# Patient Record
Sex: Female | Born: 1980 | Smoking: Never smoker
Health system: Southern US, Community
[De-identification: ages and names within clinical notes are randomized; demographics above are authoritative.]

---

## 2015-10-28 ENCOUNTER — Other Ambulatory Visit: Payer: Self-pay | Admitting: Obstetrics and Gynecology

## 2015-10-28 DIAGNOSIS — Z1231 Encounter for screening mammogram for malignant neoplasm of breast: Secondary | ICD-10-CM

## 2015-10-31 ENCOUNTER — Ambulatory Visit: Payer: Self-pay

## 2015-11-05 ENCOUNTER — Other Ambulatory Visit: Payer: Self-pay | Admitting: Obstetrics and Gynecology

## 2015-11-05 ENCOUNTER — Ambulatory Visit
Admission: RE | Admit: 2015-11-05 | Discharge: 2015-11-05 | Disposition: A | Discharge status: Home / Self Care | Payer: Managed Care, Other (non HMO) | Source: Ambulatory Visit | Attending: Obstetrics and Gynecology | Admitting: Obstetrics and Gynecology

## 2015-11-05 DIAGNOSIS — Z1231 Encounter for screening mammogram for malignant neoplasm of breast: Secondary | ICD-10-CM | POA: Diagnosis not present

## 2016-12-16 ENCOUNTER — Observation Stay (HOSPITAL_COMMUNITY)
Admission: AD | Admit: 2016-12-16 | Discharge: 2016-12-17 | Disposition: A | Discharge status: Home / Self Care | Payer: BLUE CROSS/BLUE SHIELD | Source: Ambulatory Visit | Attending: Obstetrics and Gynecology | Admitting: Obstetrics and Gynecology

## 2016-12-16 ENCOUNTER — Encounter (HOSPITAL_COMMUNITY): Payer: Self-pay | Admitting: *Deleted

## 2016-12-16 DIAGNOSIS — Z7984 Long term (current) use of oral hypoglycemic drugs: Secondary | ICD-10-CM | POA: Diagnosis not present

## 2016-12-16 DIAGNOSIS — N764 Abscess of vulva: Secondary | ICD-10-CM | POA: Diagnosis present

## 2016-12-16 DIAGNOSIS — E119 Type 2 diabetes mellitus without complications: Secondary | ICD-10-CM | POA: Insufficient documentation

## 2016-12-16 LAB — CBC WITH DIFFERENTIAL/PLATELET
BASOS PCT: 1 %
Basophils Absolute: 0 10*3/uL (ref 0.0–0.1)
EOS ABS: 0.1 10*3/uL (ref 0.0–0.7)
EOS PCT: 2 %
HCT: 36.9 % (ref 36.0–46.0)
Hemoglobin: 13.2 g/dL (ref 12.0–15.0)
LYMPHS ABS: 1.5 10*3/uL (ref 0.7–4.0)
Lymphocytes Relative: 22 %
MCH: 30.3 pg (ref 26.0–34.0)
MCHC: 35.8 g/dL (ref 30.0–36.0)
MCV: 84.6 fL (ref 78.0–100.0)
MONO ABS: 0.2 10*3/uL (ref 0.1–1.0)
MONOS PCT: 3 %
NEUTROS PCT: 72 %
Neutro Abs: 4.9 10*3/uL (ref 1.7–7.7)
PLATELETS: 237 10*3/uL (ref 150–400)
RBC: 4.36 MIL/uL (ref 3.87–5.11)
RDW: 12.5 % (ref 11.5–15.5)
WBC: 6.8 10*3/uL (ref 4.0–10.5)

## 2016-12-16 LAB — BASIC METABOLIC PANEL
Anion gap: 8 (ref 5–15)
BUN: 15 mg/dL (ref 6–20)
CHLORIDE: 98 mmol/L — AB (ref 101–111)
CO2: 27 mmol/L (ref 22–32)
CREATININE: 0.77 mg/dL (ref 0.44–1.00)
Calcium: 9.2 mg/dL (ref 8.9–10.3)
GFR calc non Af Amer: >60 mL/min (ref >60)
Glucose, Bld: 319 mg/dL — ABNORMAL HIGH (ref 65–99)
Potassium: 4.1 mmol/L (ref 3.5–5.1)
SODIUM: 133 mmol/L — AB (ref 135–145)

## 2016-12-16 MED ORDER — INSULIN GLARGINE 100 UNIT/ML ~~LOC~~ SOLN
36.0000 [IU] | Freq: Every day | SUBCUTANEOUS | Status: DC
  Administered 2016-12-16: 36 [IU] via SUBCUTANEOUS
  Filled 2016-12-16 (×2): qty 0.36

## 2016-12-16 MED ORDER — PRENATAL MULTIVITAMIN CH
1.0000 | ORAL_TABLET | Freq: Every day | ORAL | Status: DC
  Administered 2016-12-17: 1 via ORAL
  Filled 2016-12-16: qty 1

## 2016-12-16 MED ORDER — METFORMIN HCL 500 MG PO TABS
1000.0000 mg | ORAL_TABLET | Freq: Every day | ORAL | Status: DC
  Filled 2016-12-16: qty 2

## 2016-12-16 MED ORDER — SERTRALINE HCL 25 MG PO TABS
25.0000 mg | ORAL_TABLET | Freq: Every day | ORAL | Status: DC
  Administered 2016-12-16: 25 mg via ORAL
  Filled 2016-12-16 (×2): qty 1

## 2016-12-16 MED ORDER — VANCOMYCIN HCL IN DEXTROSE 1-5 GM/200ML-% IV SOLN
1000.0000 mg | Freq: Three times a day (TID) | INTRAVENOUS | Status: DC
  Administered 2016-12-17 (×2): 1000 mg via INTRAVENOUS
  Filled 2016-12-16 (×3): qty 200

## 2016-12-16 MED ORDER — DOCUSATE SODIUM 100 MG PO CAPS
100.0000 mg | ORAL_CAPSULE | Freq: Two times a day (BID) | ORAL | Status: DC
  Administered 2016-12-16: 100 mg via ORAL
  Filled 2016-12-16 (×2): qty 1

## 2016-12-16 MED ORDER — VANCOMYCIN HCL IN DEXTROSE 1-5 GM/200ML-% IV SOLN
1000.0000 mg | Freq: Once | INTRAVENOUS | Status: AC
  Administered 2016-12-16: 1000 mg via INTRAVENOUS
  Filled 2016-12-16: qty 200

## 2016-12-16 MED ORDER — ACETAMINOPHEN 325 MG PO TABS
650.0000 mg | ORAL_TABLET | ORAL | Status: DC | PRN
  Administered 2016-12-16 – 2016-12-17 (×4): 650 mg via ORAL
  Filled 2016-12-16 (×4): qty 2

## 2016-12-16 MED ORDER — LACTATED RINGERS IV SOLN
INTRAVENOUS | Status: DC
  Administered 2016-12-16: 22:00:00 via INTRAVENOUS

## 2016-12-16 MED ORDER — VANCOMYCIN HCL 500 MG IV SOLR: 500.0000 mg | Freq: Two times a day (BID) | INTRAVENOUS | Status: DC

## 2016-12-16 MED ORDER — OXYCODONE-ACETAMINOPHEN 5-325 MG PO TABS: 1.0000 | ORAL_TABLET | ORAL | Status: DC | PRN

## 2016-12-16 NOTE — Progress Notes (Signed)
Pharmacy Antibiotic Note  Virginia Ellis is a 36 y.o. female admitted on 12/16/2016 with vulvar abscess.  Pharmacy has been consulted for Vancomycin dosing after pt has failed outpatient tx with Clindamycin x 2 days. I&D performed in office today.   Plan: Vancomycin 1 gram IV q8h Will plan to follow and assess need for Vanc trough depending on duration of therapy. Will follow results/sensitivities of wound culture   Height: 5\' 11"  (180.3 cm) Weight: 205 lb (93 kg) IBW/kg (Calculated) : 70.8  Temp (24hrs), Avg:99.7 F (37.6 C), Min:98.7 F (37.1 C), Max:100.3 F (37.9 C)   Recent Labs Lab 12/16/16 1817  WBC 6.8  CREATININE 0.77    Estimated Creatinine Clearance: 121.1 mL/min (by C-G formula based on SCr of 0.77 mg/dL).    No Known Allergies  Antimicrobials this admission:   Dose adjustments this admission:   Microbiology results: Wound culture taken today at I&D site  Thank you for allowing pharmacy to be a part of this patient's care.  Claybon Jabsngel, Quintez Maselli G 12/16/2016 7:53 PM

## 2016-12-16 NOTE — H&P (Signed)
Virginia Ellis is an 36 y.o. female. Presenting with vulvar abscess. She was treated as an outpatient with Clindamycin and had worsening of abscess over 48 hours. She has a h/o poorly controlled T2DM. I&D done in office and packed with iodoform gauze. Patient reports subjective shaking chills and feels feverish. Given this clinical picture, she was admitted for IV abx.   Pertinent Gynecological History: Regular mesnes Last pap: Normal 2017 OB History: G0   No LMP recorded.    PMH:  T2DM Depression/anxiety  No past surgical history on file.  No family history on file.  Social History:  has no tobacco, alcohol, and drug history on file.  Allergies: Allergies not on file  No prescriptions prior to admission.    ROS  Blood pressure (!) 137/91, pulse (!) 106, temperature 98.7 F (37.1 C), temperature source Oral, resp. rate 17, SpO2 99 %. Physical Exam Gen: well-appearing, NAD. Anxious Pulm: NWOB Cv: tachy to low 100s Abd: soft, obese, nontender GYN: L labia with ~5cm abscess s/p I&D w/packing in place. +erythema, induration, swelling. + purulent drainage from I&D site. Culture sent in office.  No results found for this or any previous visit (from the past 24 hour(s)).  No results found.  Assessment/Plan: 35yo admitted for observation and IV abx given worsening of vulvar abscess on PO abx and rf of poorly controlled Dm.   Vulvar abscess: likely s/s poorly controlled Dm. CTM closely. S/p clindamycin x 2 days with worsening of surrounding cellulitis. Now s/p I&D in the office. 1cm incision that is packed - CBC/BMP sent - wound care c/s for home packing - vancomycin as suspect MRSA as cause with some s/s od systemic illness (tachycardia/shaking chills) and RF of DM; transition to PO abx once improv't  - wound cx taken in office, will f/u results/sensitivities - consider BCX if fever >100.70F  Poorly controlled T2DM: - A1c - cont home insulin - 36 units - pre-meal BS per DM  protocol  Anxiety/depression: - cont sertraline 25mg  QHS  Virginia Ellis 12/16/2016, 6:08 PM

## 2016-12-17 DIAGNOSIS — N764 Abscess of vulva: Secondary | ICD-10-CM | POA: Diagnosis not present

## 2016-12-17 LAB — GLUCOSE, CAPILLARY
GLUCOSE-CAPILLARY: 260 mg/dL — AB (ref 65–99)
Glucose-Capillary: 268 mg/dL — ABNORMAL HIGH (ref 65–99)
Glucose-Capillary: 424 mg/dL — ABNORMAL HIGH (ref 65–99)

## 2016-12-17 LAB — HEMOGLOBIN A1C
HEMOGLOBIN A1C: 11.4 % — AB (ref 4.8–5.6)
MEAN PLASMA GLUCOSE: 280 mg/dL

## 2016-12-17 MED ORDER — INSULIN GLARGINE 100 UNIT/ML ~~LOC~~ SOLN: 36.0000 [IU] | Freq: Every day | SUBCUTANEOUS | 11 refills | Status: AC

## 2016-12-17 MED ORDER — METFORMIN HCL 500 MG PO TABS
1000.0000 mg | ORAL_TABLET | Freq: Every day | ORAL | Status: DC
  Administered 2016-12-17: 1000 mg via ORAL
  Filled 2016-12-17 (×2): qty 2

## 2016-12-17 MED ORDER — SULFAMETHOXAZOLE-TRIMETHOPRIM 800-160 MG PO TABS
1.0000 | ORAL_TABLET | Freq: Two times a day (BID) | ORAL | Status: DC
  Administered 2016-12-17: 1 via ORAL
  Filled 2016-12-17 (×3): qty 1

## 2016-12-17 MED ORDER — INSULIN ASPART 100 UNIT/ML ~~LOC~~ SOLN
0.0000 [IU] | Freq: Three times a day (TID) | SUBCUTANEOUS | Status: DC
  Administered 2016-12-17: 8 [IU] via SUBCUTANEOUS

## 2016-12-17 MED ORDER — INSULIN ASPART 100 UNIT/ML ~~LOC~~ SOLN
4.0000 [IU] | Freq: Three times a day (TID) | SUBCUTANEOUS | Status: DC
  Administered 2016-12-17: 4 [IU] via SUBCUTANEOUS

## 2016-12-17 MED ORDER — INSULIN ASPART 100 UNIT/ML ~~LOC~~ SOLN: 4.0000 [IU] | Freq: Three times a day (TID) | SUBCUTANEOUS | 11 refills | Status: AC

## 2016-12-17 MED ORDER — INSULIN ASPART 100 UNIT/ML ~~LOC~~ SOLN
0.0000 [IU] | Freq: Every day | SUBCUTANEOUS | Status: DC
  Administered 2016-12-17: 5 [IU] via SUBCUTANEOUS

## 2016-12-17 MED ORDER — SULFAMETHOXAZOLE-TRIMETHOPRIM 800-160 MG PO TABS
1.0000 | ORAL_TABLET | Freq: Two times a day (BID) | ORAL | 0 refills | Status: AC
Start: 2016-12-17 — End: 2016-12-27

## 2016-12-17 MED ORDER — LIVING WELL WITH DIABETES BOOK
Freq: Once | Status: AC
  Administered 2016-12-17: 10:00:00
  Filled 2016-12-17: qty 1

## 2016-12-17 NOTE — Consult Note (Signed)
WOC Nurse wound consult note Reason for Consult: I & D site to left labia. Wound type:Infectious  Pressure Injury POA: N/A Measurement: 0.5 cm opening 0.7 cm in depth.  Surrounding induration and erythema.   Wound bed:Not visible Drainage (amount, consistency, odor) None noted with assessment.  Serosanguinous drainage noted on dressing.  Periwound:Erythema and induration Dressing procedure/placement/frequency:Cleanse wound to left labia with NS and pat gently dry.  Gently fill wound bed with Iodoform packing strip (provided to patient).  Cover with 4x4 gauze and secure with tape.  Change daily.  HH to complete an education session to reinforce to partner.  Educated to remove soiled 4x4 if urine gets on the bandage and cover with a clean 4x4.  Verbalized understanding.  Will be discharged with oral antibiotics, pain medication and wound care supplies.  Will not follow at this time.  Please re-consult if needed.  Maple HudsonKaren Daymen Hassebrock RN BSN CWON Pager [phone]

## 2016-12-17 NOTE — Progress Notes (Signed)
CSW received consult for home health to be arranged prior to discharge today.  The Case Management department handles this need.  CSW left message for T. Craft/Case Manager to inform her of consult.  CSW spoke with pt's bedside RN to ask that she follow up with Case Manager.  CSW is screening out referral at this time.  Please call CSW if social/emotional concerns are noted.

## 2016-12-17 NOTE — Progress Notes (Signed)
Inpatient Diabetes Program Recommendations  AACE/ADA: New Consensus Statement on Inpatient Glycemic Control (2015)  Target Ranges:  Prepandial:   less than 140 mg/dL      Peak postprandial:   less than 180 mg/dL (1-2 hours)      Critically ill patients:  140 - 180 mg/dL   Lab Results  Component Value Date   GLUCAP 260 (H) 12/17/2016   HGBA1C 11.4 (H) 12/16/2016    Review of Glycemic Control Results for Virginia Ellis, Virginia Ellis (MRN [id number]) as of 12/17/2016 10:03  Ref. Range 12/17/2016 00:06 12/17/2016 09:05  Glucose-Capillary Latest Ref Range: 65 - 99 mg/dL 409424 (H) 811260 (H)   Diabetes history: DM2 Outpatient Diabetes medications: Lantus 36 units q hs + Metformin 1 gm bid + Aspart 5 units ac dinner Current orders for Inpatient glycemic control: Lantus 36 units q hs + Metformin 1 gm bid + Novolog correction 0-15 units tid + 0-5 units hs  Inpatient Diabetes Program Recommendations:  Spoke with patient by phone. Patient has been taking her Lantus 36 units daily but has not been taking her Metformin due to some GI upset and has not been taking her Aspart insulin. Reviewed with patient current A1c is 11.4.  (average CBG 280 over the past 2-3 months) along with risks of elevated CBGs. Also discussed need to keep appointments and followups with PCP and endo. Patient states she has not followed up with endocrinologist due to change in insurance and will be able to followup soon. Endocrinologist unaware patient is not taking Metformin and Aspart insulin. Ordered Living Well With Diabetes. May benefit by adding Novolog 4 units meal coverage tid if eats 50%. Will follow.  Thank you, Billy FischerJudy E. Denelle Capurro, RN, MSN, CDE Inpatient Glycemic Control Team Team Pager [phone] (8am-5pm) 12/17/2016 10:17 AM

## 2016-12-17 NOTE — Progress Notes (Signed)
S: Feeling improved. Tolerating vanc well.  O:  Vitals:   12/17/16 0012 12/17/16 0458  BP: 127/66 121/75  Pulse: 96 78  Resp: 16 17  Temp: 99.6 F (37.6 C) 98.2 F (36.8 C)    Gen: NAD, A&O Cv: reg rate Pulm: NWOB GYN: L labia with improving abscess. Now ~2cm, still draining, packing removed and replaced. Erythema regressing. Induration minimal, swelling improved.   A/P: 35yo admitted for observation and IV abx given worsening of vulvar abscess on PO abx and rf of poorly controlled Dm.   Vulvar abscess: likely s/s poorly controlled Dm. CTM closely. S/p clindamycin x 2 days with worsening of surrounding cellulitis. Now s/p I&D in the office. 1cm incision that is packed. Improved one exam this Am.  - wound care c/s for home packing - cont vancomycin, transition to PO abx later today - wound cx taken in office, will f/u results/sensitivities - consider BCX if fever >100.18F  Poorly controlled T2DM: - A1c 11.4%, BS in house 300-400s - cont home insulin - 36 units; Cont SSI while in house, goal BS <200 - restart Metformin 1000k (pt wasn't taking b/c "forgets") - pre-meal BS per DM protocol - c/s diabetes counselor  Anxiety/depression: - cont sertraline 25mg  QHS

## 2016-12-17 NOTE — Progress Notes (Signed)
MD notified of 260 CBG, wound care consult and diabetes consult.

## 2016-12-17 NOTE — Discharge Summary (Signed)
GYN Discharge Summary     Patient Name: Virginia Ellis DOB: Sep 01, 1981 MRN: [id number]  Date of admission: 12/16/2016 Delivering MD: This patient has no babies on file.  Date of discharge: 12/17/2016  Admitting diagnosis: IV ANTIBIOTICS Intrauterine pregnancy: Unknown     Secondary diagnosis:  Active Problems:   Vulvar abscess  Additional problems: Poorly controlled T2DM     Discharge diagnosis: Vulvar abscess on left labia, poorly controleld T2DM                                            Complications: None  Hospital course:  35yo admitted for observation and IV abx given worsening of vulvar abscess on PO abx and rf of poorly controlled Dm. Abscess likely s/s poorly controlled Dm. She was s/p clindamycin x 2 days with worsening of surrounding cellulitis. She had an I&D in the office and was admitted for IV vanc. She received 24 hours of vanc with improvement in abscess. The abscess was packed and cleaned - and dressings changed BID. Wound care was consulted and scheduled home health to care for her wound upon d/c.  She was noted to have an A1c of 11.4%, BS in house 300-400s She was continued on home insulin - 36 units and her Metformin 1000k was restarted. She met with a diabetes counselor who recommended initiating insulin with meals as well. This was performed and sugars improved.    Physical exam  Vitals:   12/17/16 0012 12/17/16 0458 12/17/16 0805 12/17/16 1158  BP: 127/66 121/75 124/67 128/83  Pulse: 96 78 79 71  Resp: '16 17 16 16  ' Temp: 99.6 F (37.6 C) 98.2 F (36.8 C) 98.1 F (36.7 C) 98.3 F (36.8 C)  TempSrc: Oral Oral Oral Oral  SpO2: 95% 99% 99% 97%  Weight:      Height:       Gen: NAD, A&O Cv: reg rate Pulm: NWOB GYN: L labia with improving abscess.Still draining, packing removed and replaced. Erythema regressing. Induration minimal, swelling improved.   Labs: Lab Results  Component Value Date   WBC 6.8 12/16/2016   HGB 13.2 12/16/2016   HCT 36.9  12/16/2016   MCV 84.6 12/16/2016   PLT 237 12/16/2016   CMP Latest Ref Rng & Units 12/16/2016  Glucose 65 - 99 mg/dL 319(H)  BUN 6 - 20 mg/dL 15  Creatinine 0.44 - 1.00 mg/dL 0.77  Sodium 135 - 145 mmol/L 133(L)  Potassium 3.5 - 5.1 mmol/L 4.1  Chloride 101 - 111 mmol/L 98(L)  CO2 22 - 32 mmol/L 27  Calcium 8.9 - 10.3 mg/dL 9.2    Discharge instruction: per After Visit Summary and "Baby and Me Booklet".  After visit meds:  Allergies as of 12/17/2016   No Known Allergies     Medication List    TAKE these medications   insulin aspart 100 UNIT/ML injection Commonly known as:  novoLOG Inject 4 Units into the skin 3 (three) times daily with meals.   insulin glargine 100 UNIT/ML injection Commonly known as:  LANTUS Inject 0.36 mLs (36 Units total) into the skin at bedtime.   metFORMIN 1000 MG tablet Commonly known as:  GLUCOPHAGE Take 500 mg by mouth 2 (two) times daily with a meal.   prenatal multivitamin Tabs tablet Take 1 tablet by mouth at bedtime.   sertraline 25 MG tablet Commonly known as:  ZOLOFT  Take 25 mg by mouth at bedtime.   sulfamethoxazole-trimethoprim 800-160 MG tablet Commonly known as:  BACTRIM DS,SEPTRA DS Take 1 tablet by mouth 2 (two) times daily.       Diet: carb modified diet  Activity: Advance as tolerated. Pelvic rest.   Outpatient follow up:4 days (tuesday) Follow up Appt:No future appointments. Follow up Visit:No Follow-up on file.   12/17/2016 Tyson Dense, MD

## 2016-12-17 NOTE — Progress Notes (Signed)
Discharge teaching complete with pt. Home health set up with case management and will come to patient's home tomorrow to do dressing change. Pt educated on insulin and demonstrated to RN on how to give herself insulin. Pt ambulated out of the hospital and discharged home to family.

## 2016-12-17 NOTE — Care Management Note (Signed)
Case Management Note  Patient Details  Name: Virginia Ellis MRN: [id number] Date of Birth: 06/15/1981  Subjective/Objective:  36 year old female admitted 12/16/16 with vulvar abscess.                 Action/Plan:D/C when medically stable.   Expected Discharge Date:  12/17/16               Expected Discharge Plan:  Home w Home Health Services  In-House Referral:  NA  Discharge planning Services  CM Consult  Post Acute Care Choice:  Home Health Choice offered to:  Patient  DME Arranged:  N/A DME Agency:     HH Arranged:  RN HH Agency:  [facility]  Status of Service:  Completed, signed off  Additional Comments:CM received HH orders and spoke with pt [facility]  Pt with no preference so [name] and confirmation received.  [name] RNC-MNN, BSN 12/17/2016, 3:06 PM

## 2017-05-20 IMAGING — MG MM SCREENING BREAST TOMO BILATERAL
8 of 12 series · 8 of 28 positions shown · non-contrast
Comparison: No prior exams.

CLINICAL DATA: Screening. Baseline screening mammogram.

EXAM:
DIGITAL SCREENING BILATERAL MAMMOGRAM WITH 3D TOMO WITH CAD

[R CC]
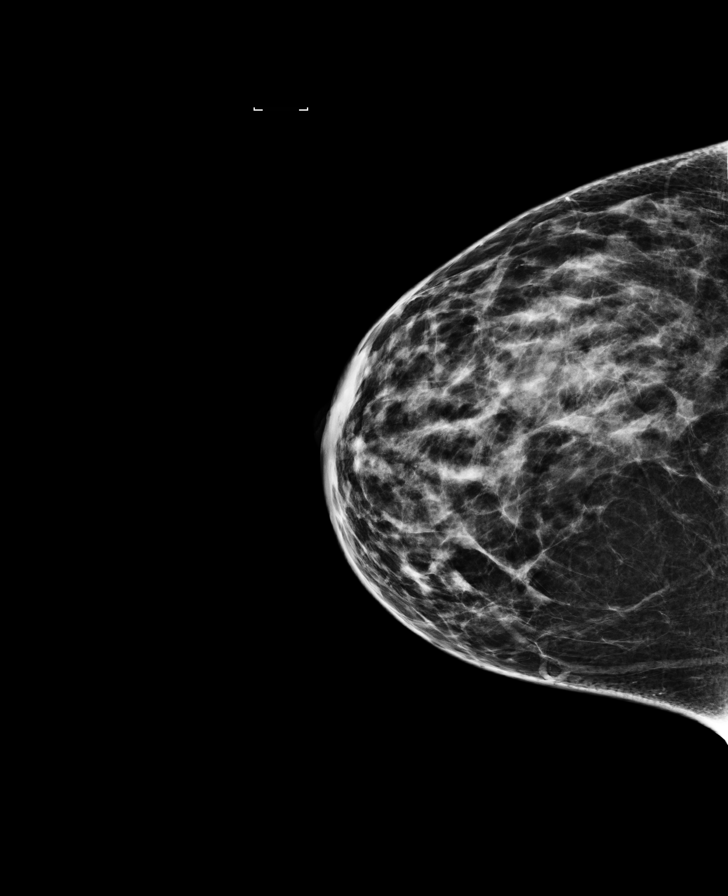

[R CC synth-2D]
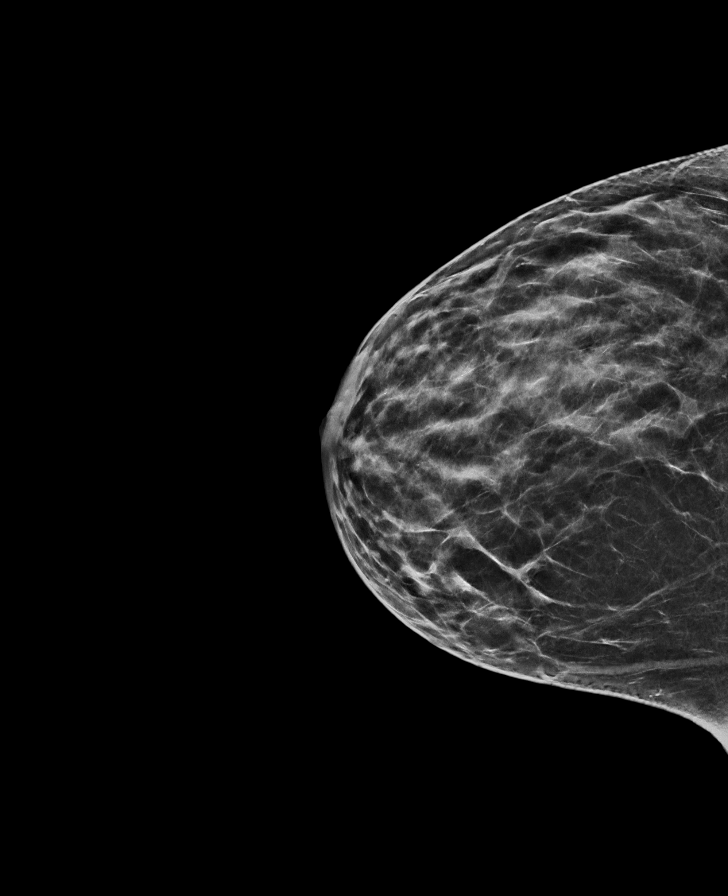

[L CC synth-2D]
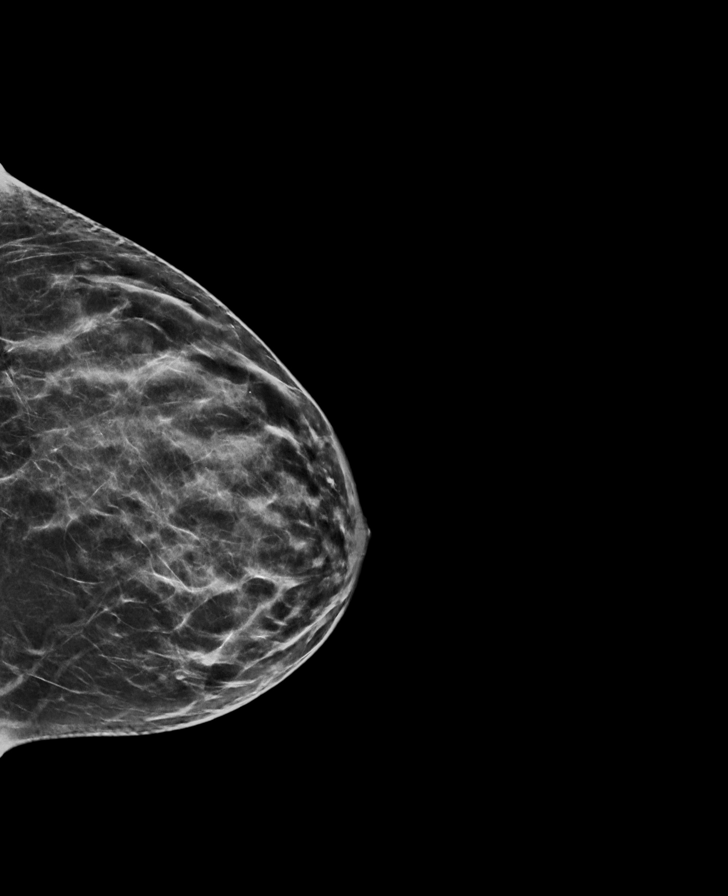

[L MLO synth-2D]
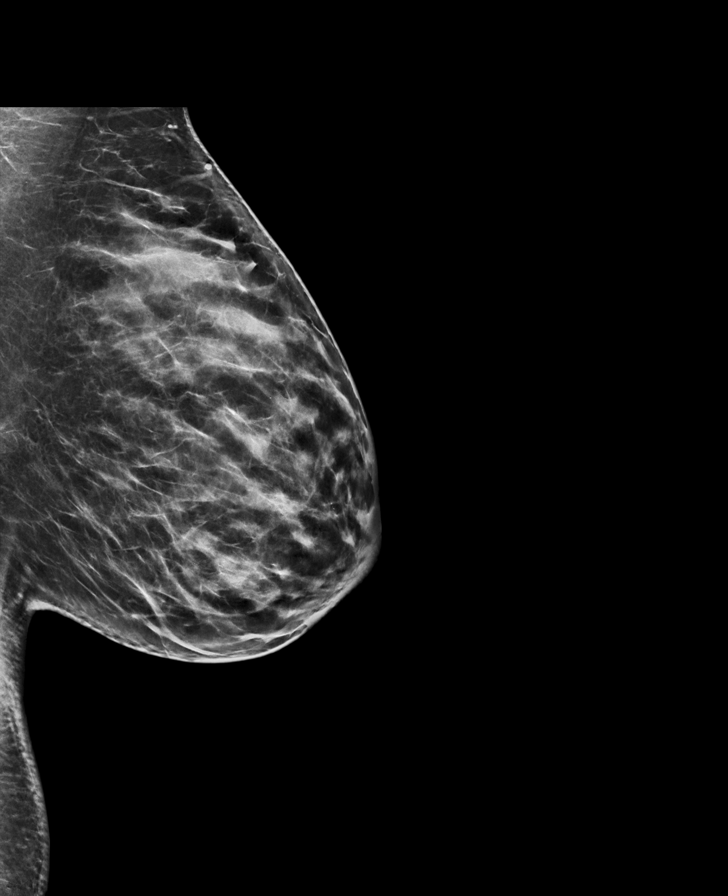

[L MLO]
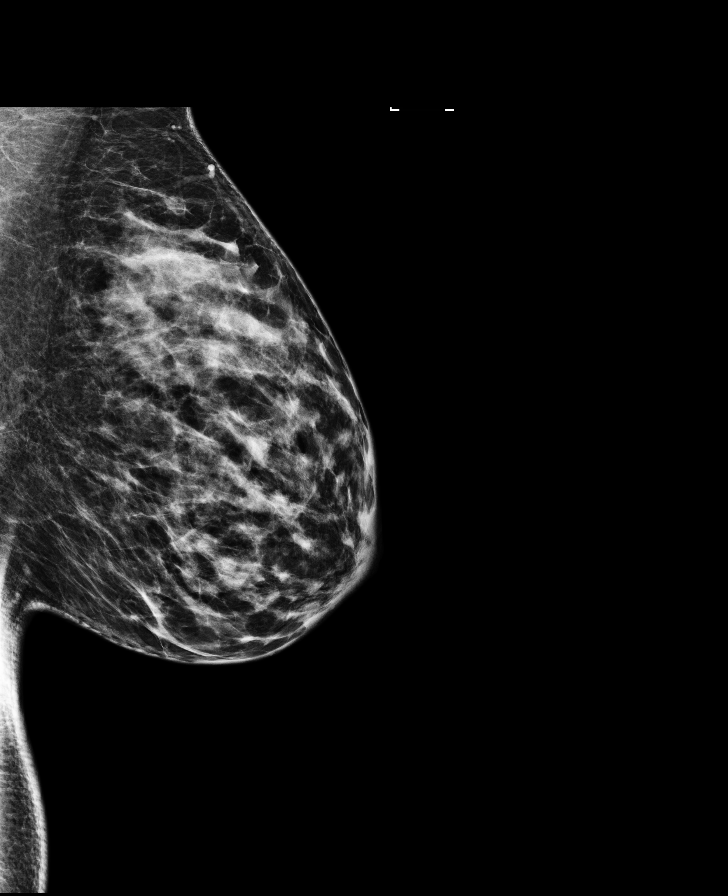

[L CC]
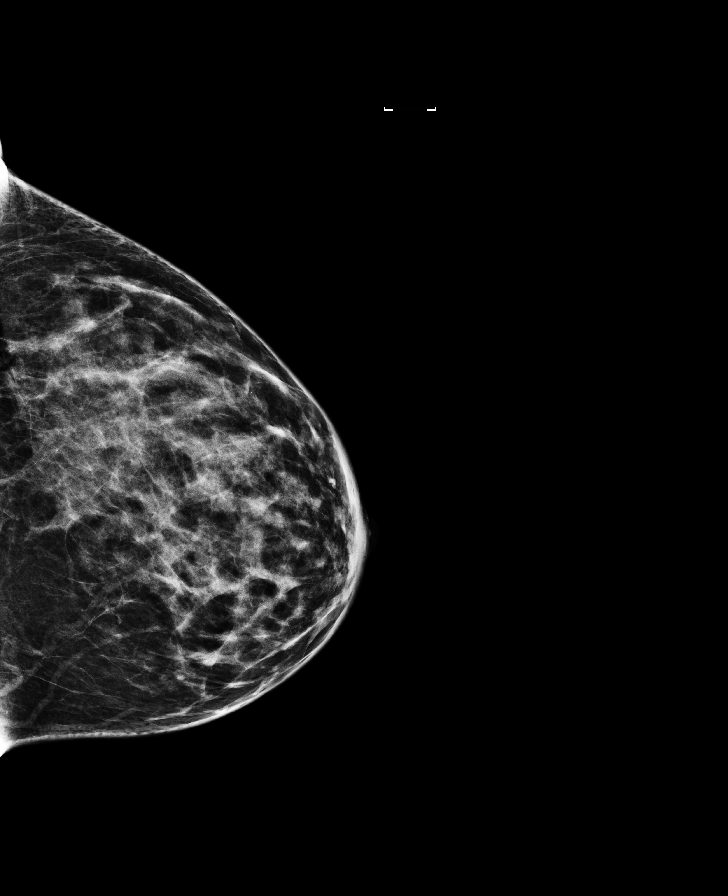

[R MLO synth-2D]
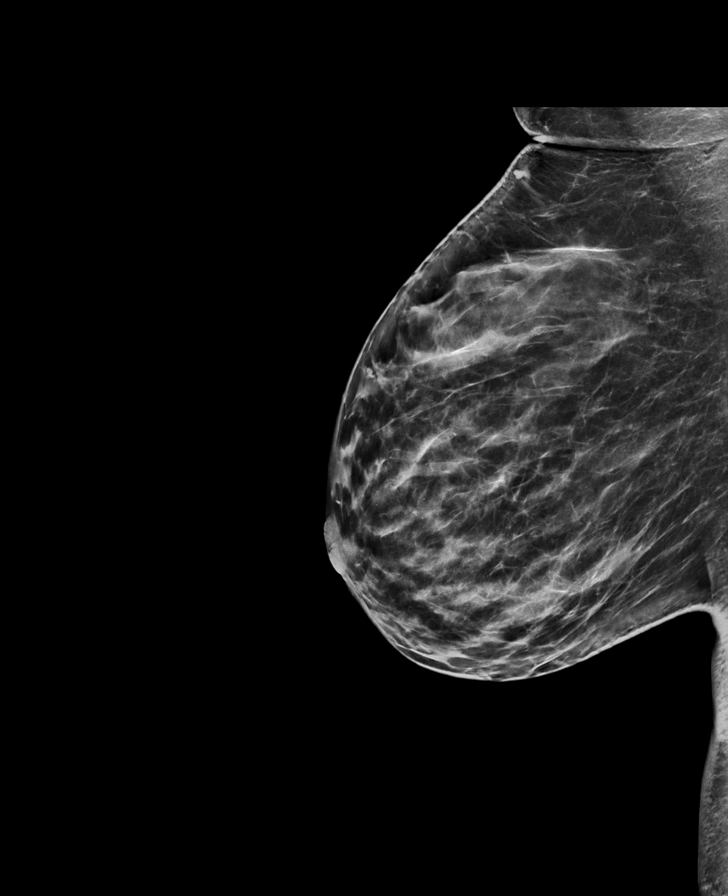

[R MLO]
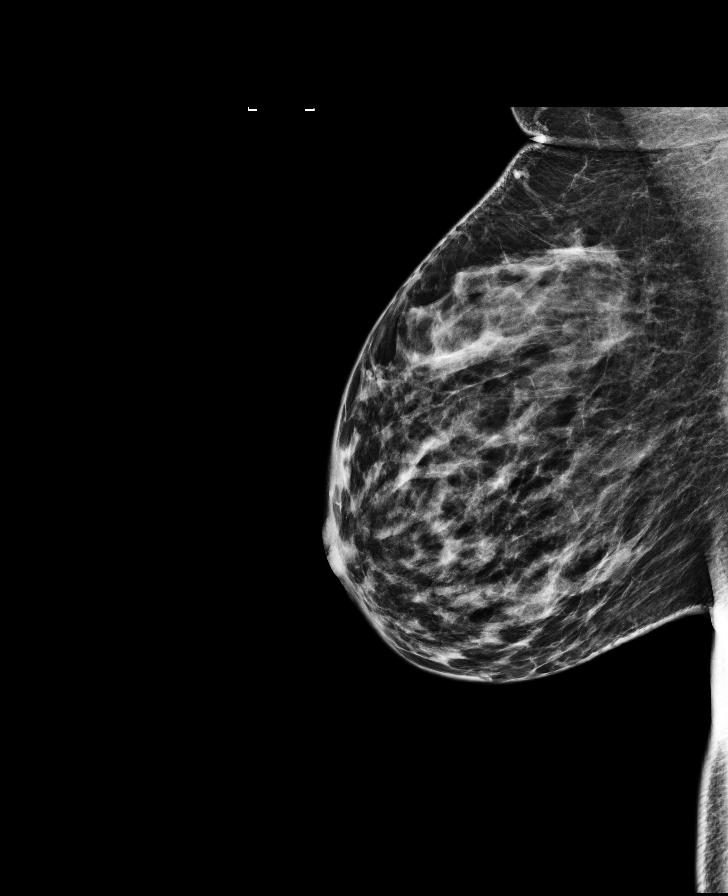

[8 of 28 positions shown; findings below may reference images not displayed]

ACR Breast Density Category c: The breast tissue is heterogeneously
dense, which may obscure small masses.
FINDINGS: There are no findings suspicious for malignancy. Images were
processed with CAD.
IMPRESSION: No mammographic evidence of malignancy. A result letter of this
screening mammogram will be mailed directly to the patient.

RECOMMENDATION:
Screening mammogram at age 38. (Code:P5-A-M1M)

BI-RADS CATEGORY  1: Negative.

## 2019-09-24 ENCOUNTER — Other Ambulatory Visit: Payer: Self-pay | Admitting: Internal Medicine

## 2019-09-24 DIAGNOSIS — R7401 Elevation of levels of liver transaminase levels: Secondary | ICD-10-CM

## 2019-10-24 ENCOUNTER — Ambulatory Visit
Admission: RE | Admit: 2019-10-24 | Discharge: 2019-10-24 | Disposition: A | Discharge status: Home / Self Care | Payer: Managed Care, Other (non HMO) | Source: Ambulatory Visit | Attending: Internal Medicine | Admitting: Internal Medicine

## 2019-10-24 DIAGNOSIS — R7401 Elevation of levels of liver transaminase levels: Secondary | ICD-10-CM

## 2019-11-06 ENCOUNTER — Ambulatory Visit
Admission: RE | Admit: 2019-11-06 | Discharge: 2019-11-06 | Disposition: A | Discharge status: Home / Self Care | Payer: Managed Care, Other (non HMO) | Source: Ambulatory Visit | Attending: Internal Medicine | Admitting: Internal Medicine

## 2021-05-08 IMAGING — US US ABDOMEN LIMITED W/ ELASTOGRAPHY
1 series · 12 of 12 positions shown · non-contrast
Comparison: None.

CLINICAL DATA: Transaminitis.

EXAM:
US ABDOMEN LIMITED - RIGHT UPPER QUADRANT
ULTRASOUND HEPATIC ELASTOGRAPHY
TECHNIQUE: Sonography of the right upper quadrant was performed. In addition,
ultrasound elastography evaluation of the liver was performed. A
region of interest was placed within the right lobe of the liver.
Following application of a compressive sonographic pulse, tissue
compressibility was assessed. Multiple assessments were performed at
the selected site. Median tissue compressibility was determined.
Previously, hepatic stiffness was assessed by shear wave velocity.
Based on recently published Society of Radiologists in Ultrasound
consensus article, reporting is now recommended to be performed in
the SI units of pressure (kiloPascals) representing hepatic
stiffness/elasticity. The obtained result is compared to the
published reference standards. (cACLD= compensated Advanced Chronic
Liver Disease)

[Series 2: us abdomen limited w/ elastography · 0.19mm/px · 12 of 12 slices shown]
[im 1/12]
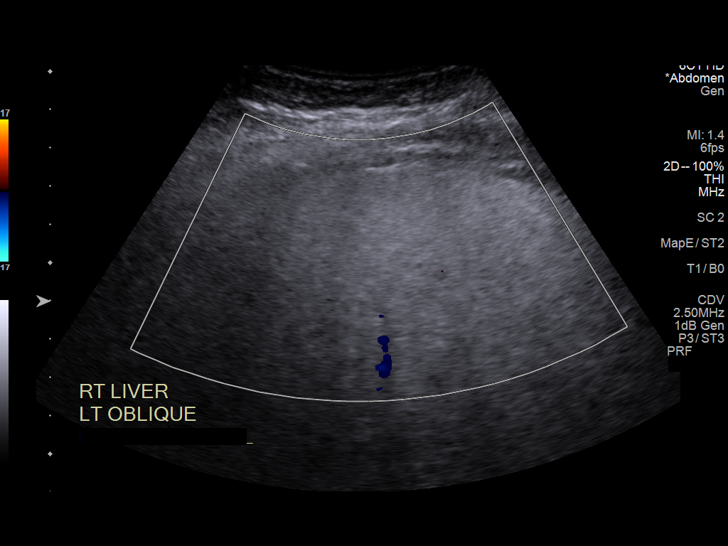
[im 2/12]
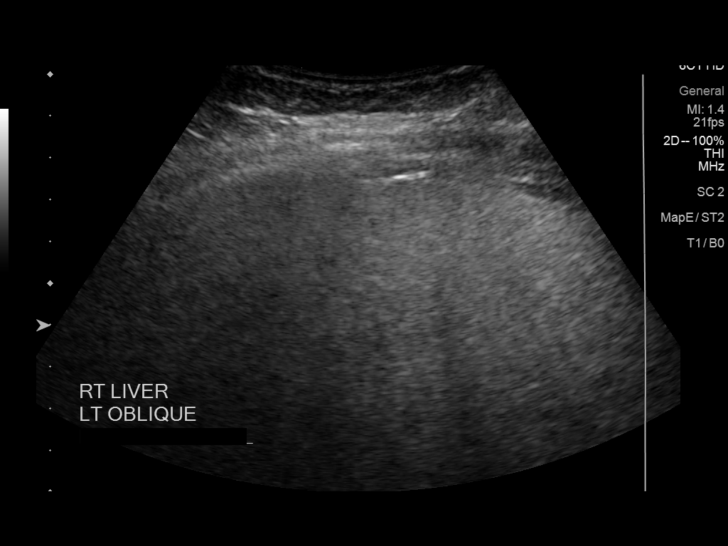
[im 3/12]
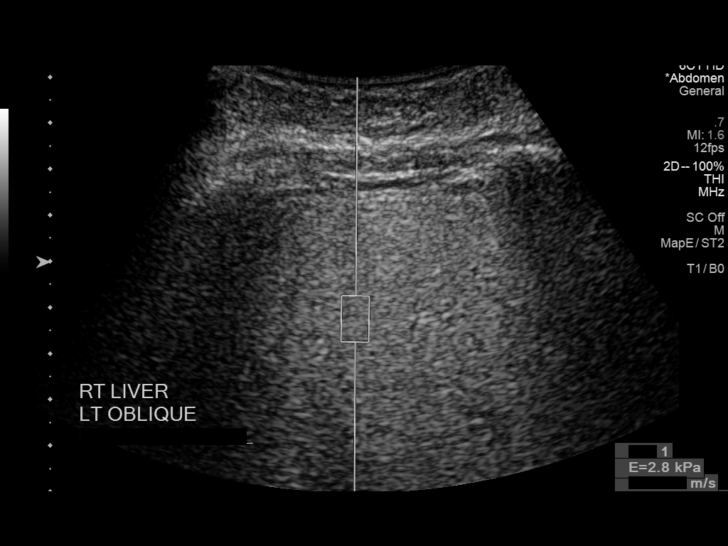
[im 4/12]
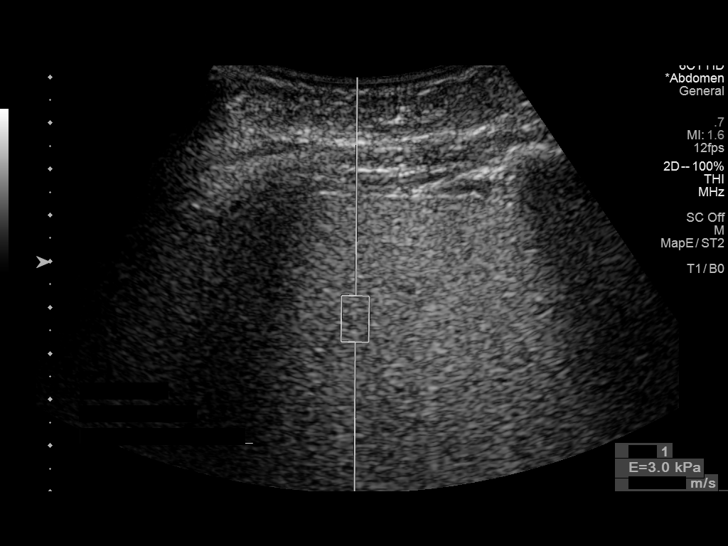
[im 5/12]
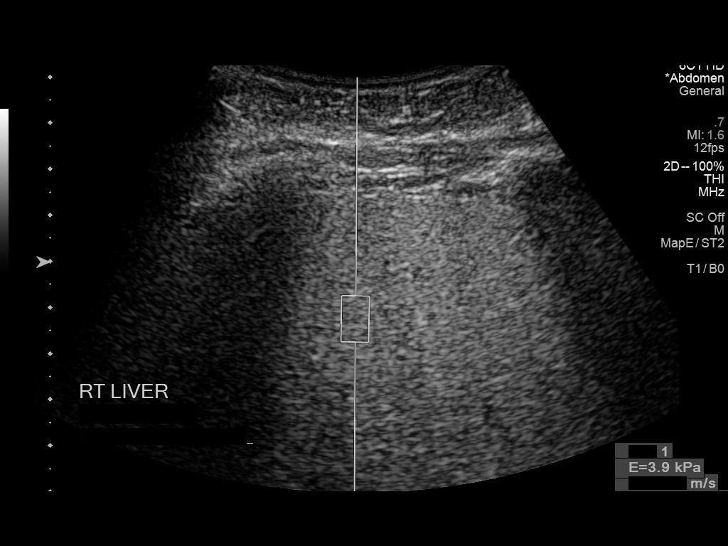
[im 6/12]
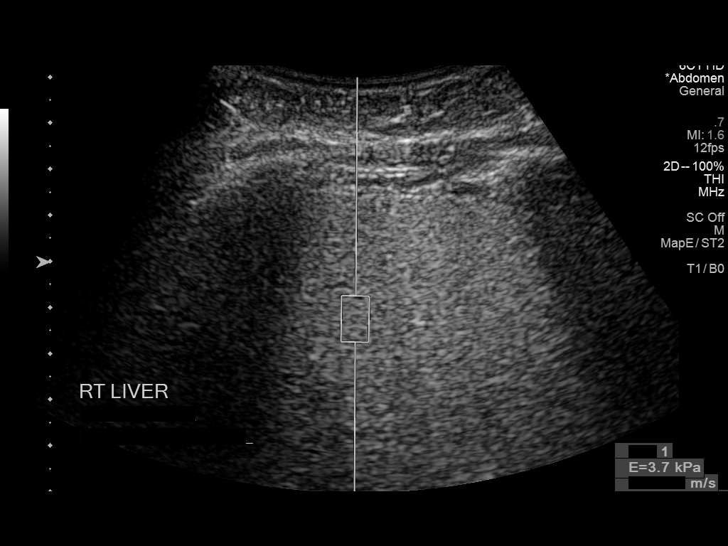
[im 7/12]
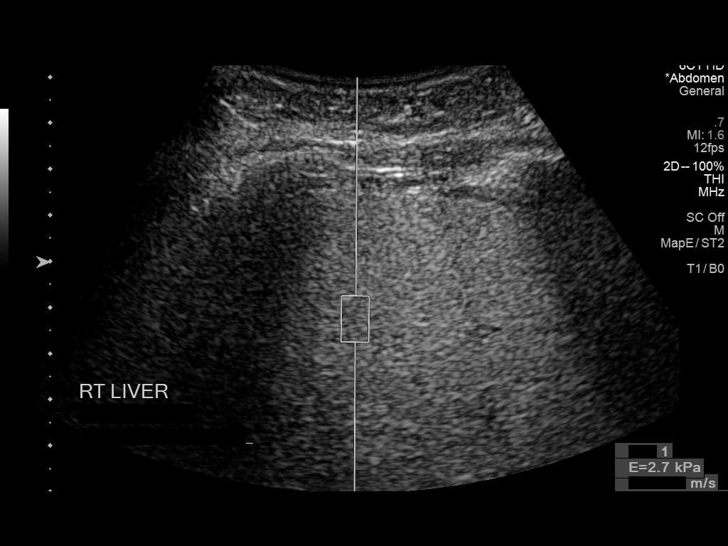
[im 8/12]
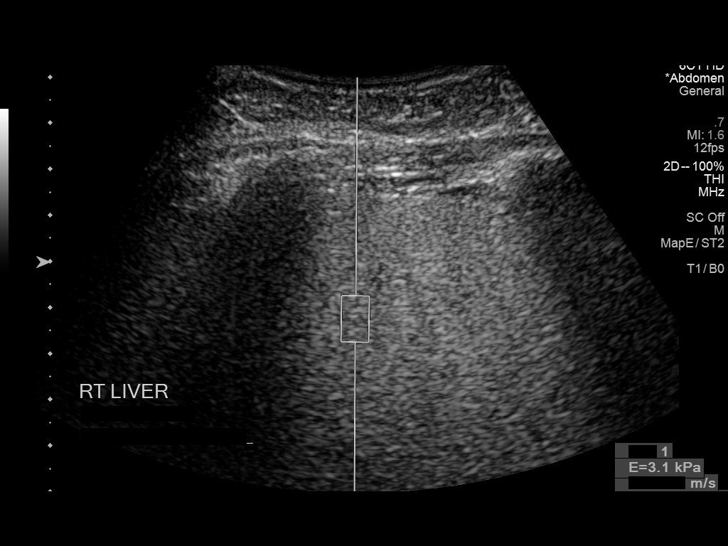
[im 9/12]
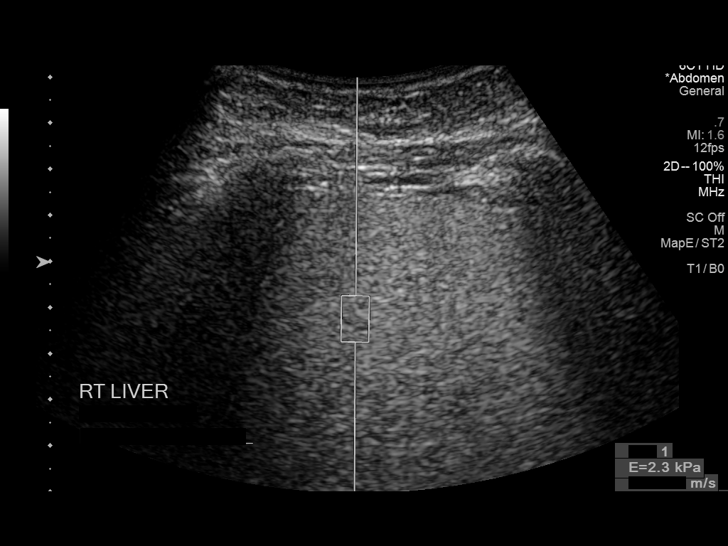
[im 10/12]
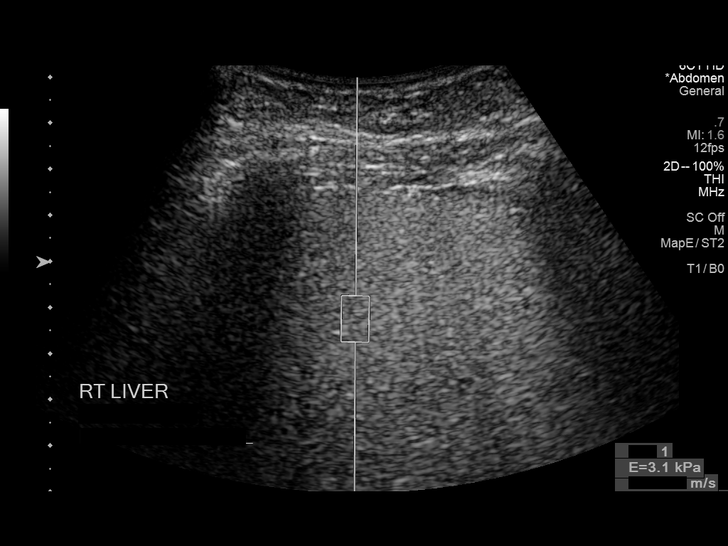
[im 11/12]
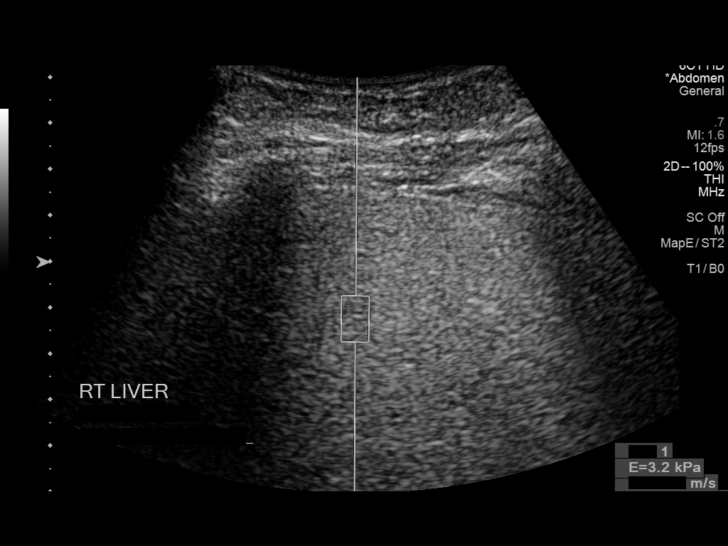
[im 12/12]
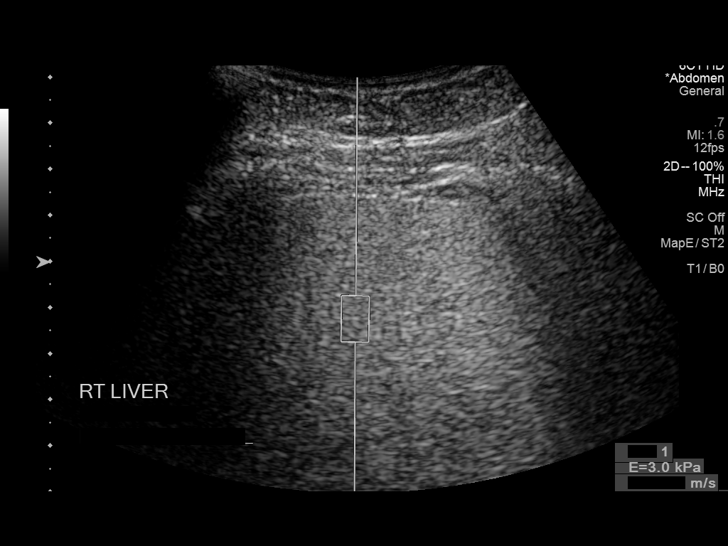

[12 of 12 positions shown; findings below may reference images not displayed]

FINDINGS: ULTRASOUND ABDOMEN LIMITED RIGHT UPPER QUADRANT

Gallbladder:

Solitary 6 mm polyp versus adherent sludge in the dependent
gallbladder wall. Otherwise normal gallbladder with no gallbladder
wall thickening, sonographic Murphy sign, pericholecystic fluid,
gallbladder distention or cholelithiasis.

Common bile duct:

Diameter: 3 mm

Liver:

Diffusely prominently echogenic liver parenchyma with posterior
acoustic attenuation, compatible with diffuse hepatic steatosis. No
definite liver surface irregularity. No liver mass, noting decreased
sensitivity in the setting of an echogenic liver. Portal vein is
patent on color Doppler imaging with normal direction of blood flow
towards the liver.

ULTRASOUND HEPATIC ELASTOGRAPHY

Device: Siemens Helix VTQ

Patient position: Oblique

Transducer [id number]

Number of measurements: 10

Hepatic segment:  8

Median kPa:

IQR:

IQR/Median kPa ratio:

Data quality:  Good

Diagnostic category:  ?5 kPa: high probability of being normal
IMPRESSION: ULTRASOUND RUQ:

1. Diffuse hepatic steatosis.
2. Solitary 6 mm gallbladder polyp versus adherent sludge ball. No
cholelithiasis. No follow-up required. This recommendation follows
ACR consensus guidelines: White Paper of the ACR Incidental Findings
Committee II on Gallbladder and Biliary Findings. [HOSPITAL]
9520:;[DATE].

ULTRASOUND HEPATIC ELASTOGRAPHY:

Median kPa:

Diagnostic category:  ?5 kPa: high probability of being normal

The use of hepatic elastography is applicable to patients with viral
hepatitis and non-alcoholic fatty liver disease. At this time, there
is insufficient data for the referenced cut-off values and use in
other causes of liver disease, including alcoholic liver disease.
Patients, however, may be assessed by elastography and serve as
their own reference standard/baseline.

In patients with non-alcoholic liver disease, the values suggesting
compensated advanced chronic liver disease (cACLD) may be lower, and
patients may need additional testing with elasticity results of [DATE]
kPa.

Please note that abnormal hepatic elasticity and shear wave
velocities may also be identified in clinical settings other than
with hepatic fibrosis, such as: acute hepatitis, elevated right
heart and central venous pressures including use of beta blockers,
Koz disease (Mireya), infiltrative processes such as
mastocytosis/amyloidosis/infiltrative tumor/lymphoma, extrahepatic
cholestasis, with hyperemia in the post-prandial state, and with
liver transplantation. Correlation with patient history, laboratory
data, and clinical condition recommended.

Diagnostic Categories:

?5 kPa: high probability of being normal

?9 kPa: in the absence of other known clinical signs, rules [DATE] kPa and ?13 kPa: suggestive of cACLD, but needs further testing

>13 kPa: highly suggestive of cACLD

?17 kPa: highly suggestive of cACLD with an increased probability of
clinically significant portal hypertension
